# Patient Record
Sex: Male | Born: 1935 | Race: White | Hispanic: No | Marital: Married
Health system: Southern US, Community
[De-identification: ages and names within clinical notes are randomized; demographics above are authoritative.]

---

## 2007-05-04 ENCOUNTER — Ambulatory Visit (HOSPITAL_COMMUNITY): Admission: RE | Admit: 2007-05-04 | Discharge: 2007-05-05 | Payer: Self-pay | Admitting: Surgery

## 2008-12-03 IMAGING — CR DG CHEST 2V
2 series · 2 of 2 positions shown · non-contrast
Comparison: None.

CLINICAL DATA: Pre-operative respiratory exam for hernia repair.   
 CHEST - 2 VIEW:

[view not recorded (1 of 2)]
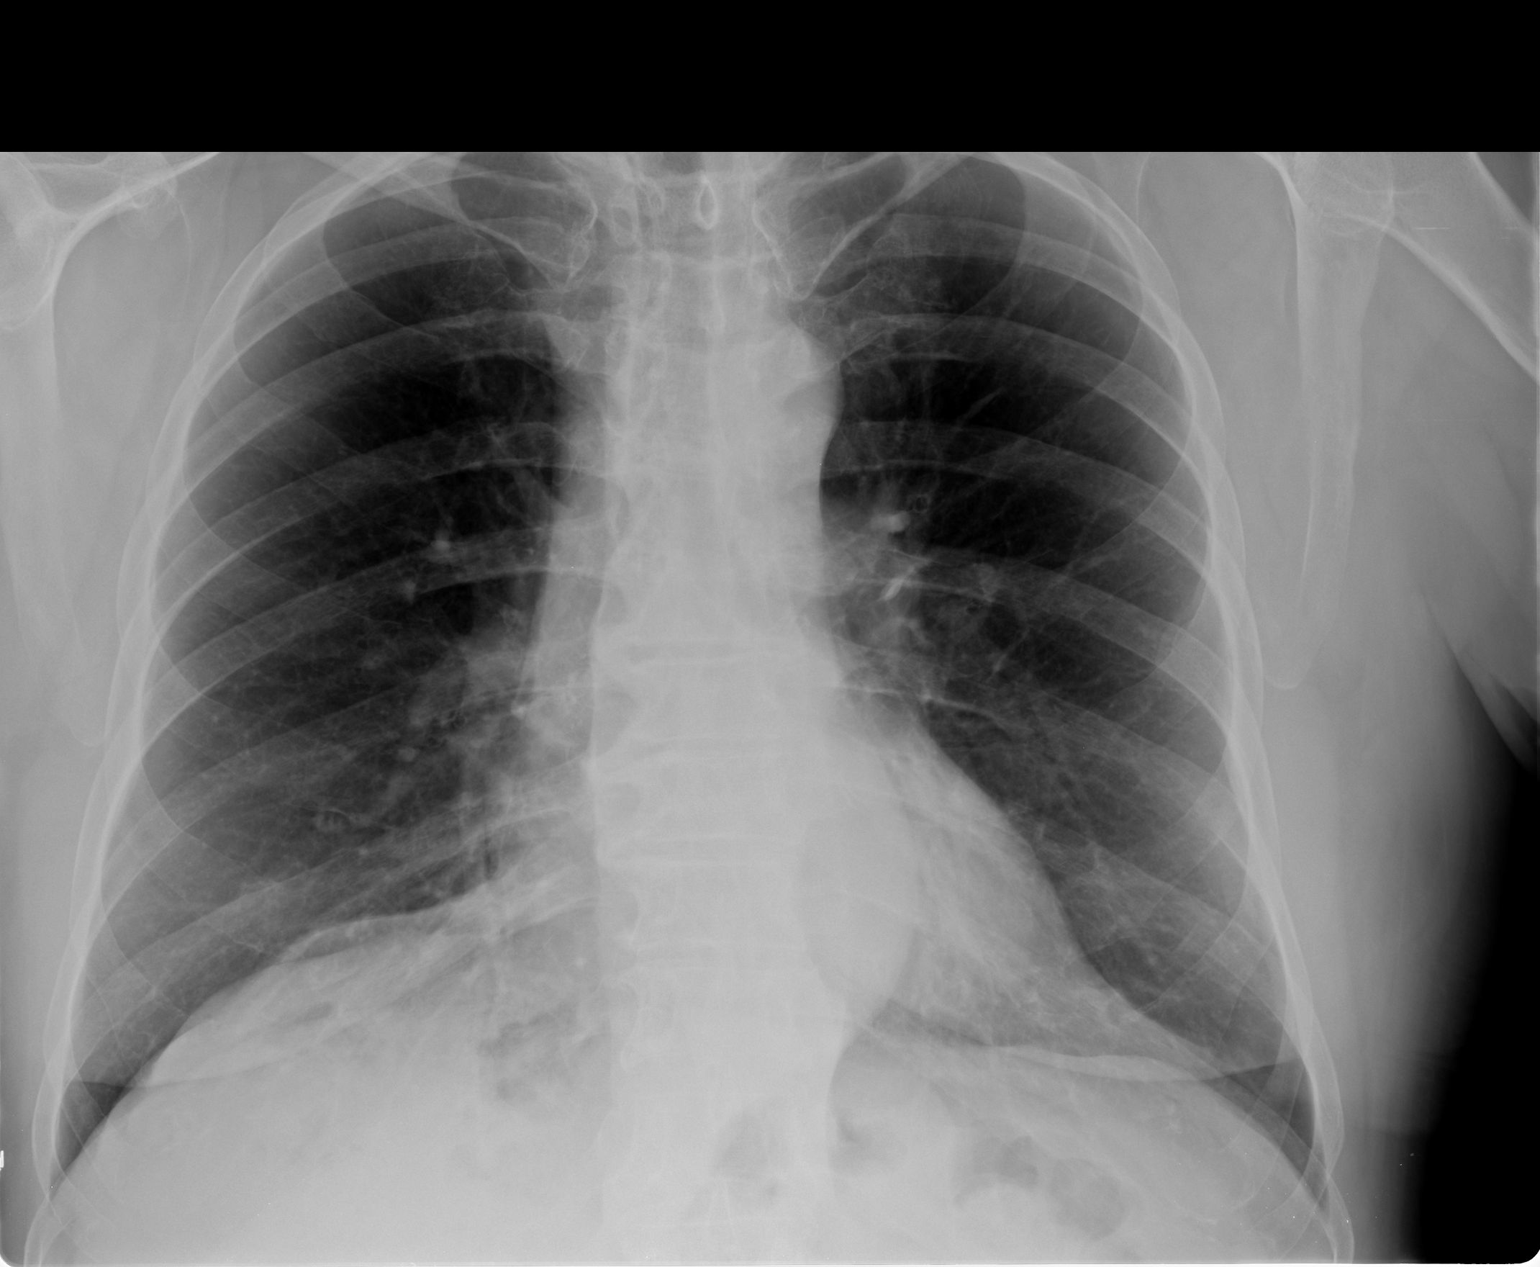

[view not recorded (2 of 2)]
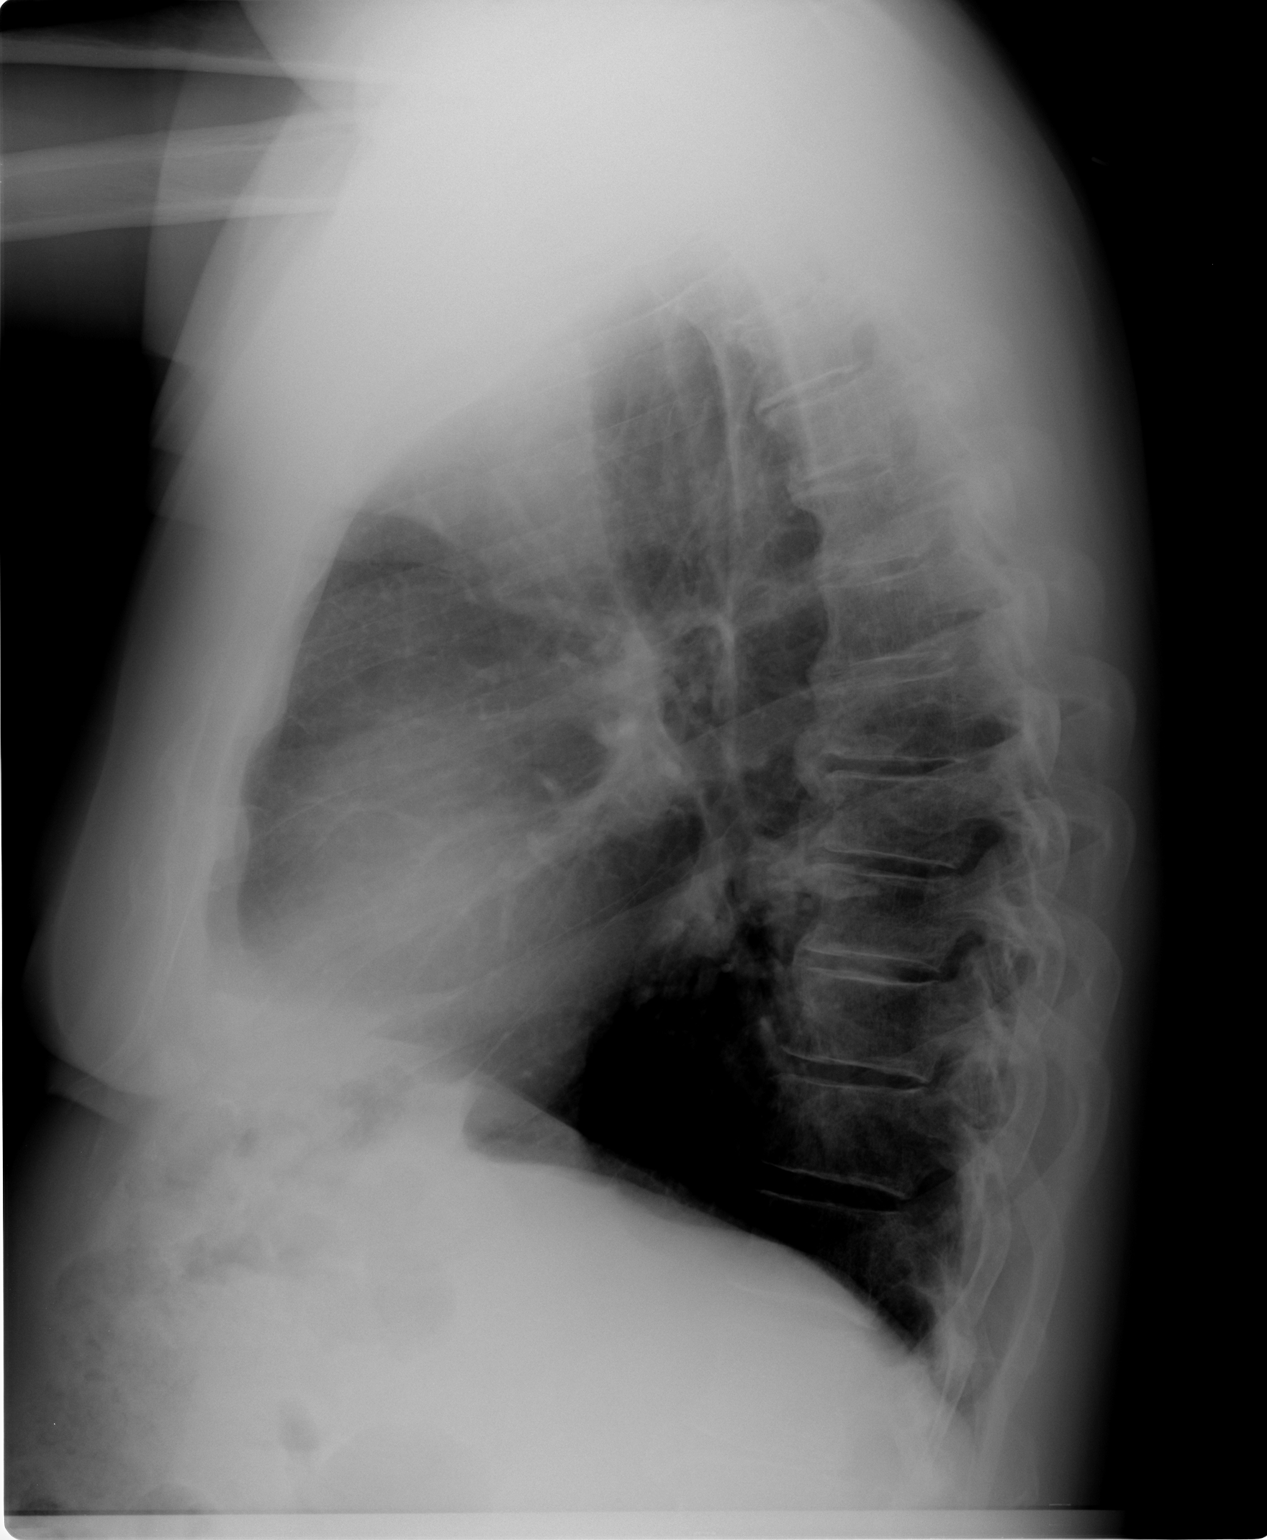

[2 of 2 positions shown; findings below may reference images not displayed]

FINDINGS: Heart size is normal.   The mediastinum is unremarkable.  There is mild scarring at the lung bases.  No evidence of active infiltrate, mass, effusion or collapse.  Bony structures unremarkable.
IMPRESSION: No active disease.

## 2009-07-03 ENCOUNTER — Encounter: Payer: Self-pay | Admitting: Cardiology

## 2009-12-04 ENCOUNTER — Encounter: Payer: Self-pay | Admitting: Cardiology

## 2010-01-08 ENCOUNTER — Encounter: Payer: Self-pay | Admitting: Cardiology

## 2010-03-20 ENCOUNTER — Encounter: Payer: Self-pay | Admitting: Cardiology

## 2010-03-24 ENCOUNTER — Ambulatory Visit: Payer: Self-pay | Admitting: Cardiology

## 2010-03-24 DIAGNOSIS — I498 Other specified cardiac arrhythmias: Secondary | ICD-10-CM

## 2010-03-24 DIAGNOSIS — R55 Syncope and collapse: Secondary | ICD-10-CM | POA: Insufficient documentation

## 2010-04-06 ENCOUNTER — Ambulatory Visit: Payer: Self-pay | Admitting: Cardiology

## 2010-04-06 ENCOUNTER — Encounter: Payer: Self-pay | Admitting: Cardiology

## 2010-04-10 ENCOUNTER — Encounter (INDEPENDENT_AMBULATORY_CARE_PROVIDER_SITE_OTHER): Payer: Self-pay | Admitting: *Deleted

## 2010-04-20 ENCOUNTER — Encounter: Payer: Self-pay | Admitting: Physician Assistant

## 2010-04-22 ENCOUNTER — Encounter (INDEPENDENT_AMBULATORY_CARE_PROVIDER_SITE_OTHER): Payer: Self-pay | Admitting: *Deleted

## 2010-05-05 ENCOUNTER — Ambulatory Visit: Payer: Self-pay | Admitting: Cardiology

## 2010-06-16 NOTE — Letter (Signed)
Summary: External Correspondence/ OFFICE VISIT DR. HASANAJ  External Correspondence/ OFFICE VISIT DR. HASANAJ   Imported By: Dorise Hiss 03/24/2010 09:49:57  _____________________________________________________________________  External Attachment:    Type:   Image     Comment:   External Document

## 2010-06-16 NOTE — Letter (Signed)
Summary: External Correspondence/ OFFICE VISIT DR. HASANAJ  External Correspondence/ OFFICE VISIT DR. HASANAJ   Imported By: Dorise Hiss 03/24/2010 09:51:21  _____________________________________________________________________  External Attachment:    Type:   Image     Comment:   External Document

## 2010-06-16 NOTE — Letter (Signed)
Summary: Engineer, materials at [facility]  [address]   [place], [place] [address]   Phone: [phone]  Fax: [phone]        April 10, 2010 MRN: [id number]   Perry Thompson [address] [place], [place]  [address]   Dear Perry Thompson,  Your test ordered by Selena Batten has been reviewed by your physician (or physician assistant) and was found to be normal or stable. Your physician (or physician assistant) felt no changes were needed at this time.  __X__ Echocardiogram  ____ Cardiac Stress Test  ____ Lab Work  ____ Peripheral vascular study of arms, legs or neck  ____ CT scan or X-ray  ____ Lung or Breathing test  ____ Other:   Thank you.   Hoover Brunette, LPN    Duane Boston, M.D., F.A.C.C. Thressa Sheller, M.D., F.A.C.C. Oneal Grout, M.D., F.A.C.C. Cheree Ditto, M.D., F.A.C.C. Daiva Nakayama, M.D., F.A.C.C. Kenney Houseman, M.D., F.A.C.C. Jeanne Ivan, PA-C

## 2010-06-16 NOTE — Letter (Signed)
Summary: Engineer, materials at [facility]  [address]   [place], [place] [address]   Phone: [phone]  Fax: [phone]        April 22, 2010 MRN: [id number]    Perry Thompson [address] [place], [place]  [address]    Dear [name],  Your test ordered by Selena Batten has been reviewed by your physician (or physician assistant) and was found to be normal or stable. Your physician (or physician assistant) felt no changes were needed at this time.  ____ Echocardiogram  ____ Cardiac Stress Test  ____ Lab Work  ____ Peripheral vascular study of arms, legs or neck  ____ CT scan or X-ray  ____ Lung or Breathing test  __X__ Other: Heart Monitor   Thank you.   Cyril Loosen, RN, BSN    Duane Boston, M.D., F.A.C.C. Thressa Sheller, M.D., F.A.C.C. Oneal Grout, M.D., F.A.C.C. Cheree Ditto, M.D., F.A.C.C. Daiva Nakayama, M.D., F.A.C.C. Kenney Houseman, M.D., F.A.C.C. Jeanne Ivan, PA-C

## 2010-06-16 NOTE — Procedures (Signed)
Summary: [facility] Final Summary  [facility] Final Summary   Imported By: Cyril Loosen, RN, BSN 04/22/2010 15:01:58  _____________________________________________________________________  External Attachment:    Type:   Image     Comment:   External Document  Appended Document: [facility] Final Summary Pt notified of results by letter.

## 2010-06-16 NOTE — Assessment & Plan Note (Signed)
Summary: NP-HEART RATE SLOW REQUESTING   Visit Type:  Initial Consult Primary Provider:  Hasanaj   History of Present Illness: patient is being evaluated for an episode of syncope. Reportedly the patient was admitted to ward on 11 4 2011. He stayed up while he was sitting in a restaurant at breakfast he passed out for roughly 30 secs after which time EMS was called. The patient initially refused to come to the emergency room and then came to the office. The patient was admitted for monitoring period with a sinus bradycardia. EKG today shows a heart rate of 57 beats per minute. He has a history of mild dementia and is on Aricept. He also has history of TIA but no prior cardiac history. He ruled out for myocardial infarction during the hospitalization. Episode in restaurant: Sitting there out about - happened suddenly, came around immediately. Happened 6 months ago presyncope. No scp, no sob- no heart disease. No orthostatsis, but wobbling when going to bathroom.    Preventive Screening-Counseling & Management  Alcohol-Tobacco     Smoking Status: quit     Year Quit: 1961  Current Medications (verified): 1)  Aspir-Low 81 Mg Tbec (Aspirin) .... Take 1 Tablet By Mouth Once A Day 2)  Aricept 10 Mg Tabs (Donepezil Hcl) .... Take 1 Tablet By Mouth Once A Day 3)  Celexa 10 Mg Tabs (Citalopram Hydrobromide) .... Take 1 Tablet By Mouth Once A Day  Allergies (verified): No Known Drug Allergies  Comments:  Nurse/Medical Assistant: The patient is currently on medications but does not know the name or dosage at this time. Instructed to contact our office with details. Will update medication list at that time.  Past History:  Past Medical History: implant prostate cancer Tonsillectomy Hernia surgery  Family History: Reviewed history and no changes required. no significant family history of heart disease  Social History: Reviewed history and no changes required. no tobacco no  alcoholSmoking Status:  quit  Review of Systems       The patient complains of fainting.  The patient denies fatigue, malaise, fever, weight gain/loss, vision loss, decreased hearing, hoarseness, chest pain, palpitations, shortness of breath, prolonged cough, wheezing, sleep apnea, coughing up blood, abdominal pain, blood in stool, nausea, vomiting, diarrhea, heartburn, incontinence, blood in urine, muscle weakness, joint pain, leg swelling, rash, skin lesions, headache, dizziness, depression, anxiety, enlarged lymph nodes, easy bruising or bleeding, and environmental allergies.    Vital Signs:  Patient profile:   77 year old male Height:      70 inches Weight:      204 pounds BMI:     29.38 Pulse rate:   59 / minute Pulse (ortho):   62 / minute BP sitting:   133 / 77  (left arm) BP standing:   127 / 78 Cuff size:   large  Vitals Entered By: Carlye Grippe (March 24, 2010 10:59 AM)  Nutrition Counseling: Patient's BMI is greater than 25 and therefore counseled on weight management options.  Serial Vital Signs/Assessments:  Time      Position  BP       Pulse  Resp  Temp     By 12:19 PM  Lying RA  106/63   64                    Gayle Hill, LPN 16:10 PM  Sitting   130/82   64  Hoover Brunette, LPN 14:78 PM  Standing  127/78   [address], LPN  Comments: 29:56 PM After 3 min:  124/80  56 After 5 min:  121/81  57   By: Hoover Brunette, LPN    Physical Exam  Additional Exam:  General: Well-developed, well-nourished in no distress head: Normocephalic and atraumatic eyes PERRLA/EOMI intact, conjunctiva and lids normal nose: No deformity or lesions mouth normal dentition, normal posterior pharynx neck: Supple, no JVD.  No masses, thyromegaly or abnormal cervical nodes lungs: Normal breath sounds bilaterally without wheezing.  Normal percussion heart: regular rate and rhythm with normal S1 and S2, no S3 or S4.  PMI is normal.  No pathological  murmurs abdomen: Normal bowel sounds, abdomen is soft and nontender without masses, organomegaly or hernias noted.  No hepatosplenomegaly musculoskeletal: Back normal, normal gait muscle strength and tone normal pulsus: Pulse is normal in all 4 extremities Extremities: No peripheral pitting edema neurologic: Alert and oriented x 3 skin: Intact without lesions or rashes cervical nodes: No significant adenopathy psychologic: Normal affect    Impression & Recommendations:  Problem # 1:  SYNCOPE AND COLLAPSE (ICD-780.2) Will order cardiac monitor and echocardiogram. Suspect bradycardia. No evidence of orthostasis. His updated medication list for this problem includes:    Aspir-low 81 Mg Tbec (Aspirin) .Marland Kitchen... Take 1 tablet by mouth once a day  Orders: Cardionet/Event Monitor (Cardionet/Event) 2-D Echocardiogram (2D Echo)  Problem # 2:  BRADYCARDIA (ICD-427.89) as outlined above. His updated medication list for this problem includes:    Aspir-low 81 Mg Tbec (Aspirin) .Marland Kitchen... Take 1 tablet by mouth once a day  Orders: Cardionet/Event Monitor (Cardionet/Event) 2-D Echocardiogram (2D Echo)  Patient Instructions: 1)  Echo 2)  21 day Cardionet monitor 3)  Follow up in  1 months

## 2010-06-16 NOTE — Letter (Signed)
Summary: External Correspondence/ OFFICE VISIT DR. HASANAJ  External Correspondence/ OFFICE VISIT DR. HASANAJ   Imported By: Dorise Hiss 03/24/2010 09:48:34  _____________________________________________________________________  External Attachment:    Type:   Image     Comment:   External Document

## 2010-09-29 NOTE — Op Note (Signed)
NAME:  MOHID, FURUYA                ACCOUNT NO.:  [id number]   MEDICAL RECORD NO.:  [id number]          PATIENT TYPE:  AMB   LOCATION:  SDS                          FACILITY:  [facility]   PHYSICIAN:  Sandria Bales. Ezzard Standing, M.D.  DATE OF BIRTH:  August 27, 1932   DATE OF PROCEDURE:  05/04/2007  DATE OF DISCHARGE:                               OPERATIVE REPORT   PREOPERATIVE DIAGNOSIS:  Right inguinal hernia, left spigelian hernia.   POSTOPERATIVE DIAGNOSIS:  Chronically incarcerated direct right inguinal  hernia, 2 x 4-cm left spigelian hernia.   PROCEDURE:  Laparoscopic right inguinal hernia repair with Bard 3-D  preformed mesh.   Laparoscopic ventral spigelian hernia repair with 12-cm Parietex mesh.   SURGEON:  Dr. Ezzard Standing  no first assistant.   ANESTHESIA:  General.   ESTIMATED BLOOD LOSS:  Minimal.   INDICATIONS FOR PROCEDURE:  Mr. Whitner is a 77 year old white male who  is a patient of Dr. Olena Leatherwood from [facility] who has  had a longstanding right inguinal hernia and left lower abdominal wall  hernia that I think is a spigelian hernia.   I discussed with him about repairing these both laparoscopically.  I  discussed both the indications and risks of repairing both hernias, the  risk includes, but are not limited to, bleeding, infection, nerve  injury, recurrence of the hernia.   OPERATIVE NOTE:  The patient was placed in a supine position, given a  general anesthetic.  His arms were tucked to his sides.  A Foley  catheter was placed.  PAS stockings were placed.  He was given a gram of  Ancef at the initiation of the procedure.  Before surgery, I had marked  where a spigelian hernia was, I had marked his right inguinal hernia,  and a time-out was held identifying the patient and the procedure.   I prepped hi abdomen with Betadine solution.  I put an Ioban drape over  his belly.  I repaired the right inguinal hernia first.  I went through  an infraumbilical incision  and got into the preperitoneal space behind  the right anterior rectus muscle.  I used the Covidien preperitoneal  balloon to dilate the preperitoneal space.  I place the 10mm Hasson  trocar infraumbilically and I placed two 5-mm trocars, one in the right  lower quadrant and one in the left lower quadrant and dissected out his  preperitoneal space.   I identified the pubic tubercle and Cooper's ligament.  He had a fairly  large, chronically incarcerated, direct inguinal hernia which was  somewhat difficult to reduce.  He had a lot of scar tissue that I was  even out laterally.  I identified his cord structures.  I saw no  evidence of an indirect inguinal hernia.   I thought therefore he would be a reasonable candidate for the large  Bard 3-D preformed mesh.  I stapled this medially to the pubic tubercle,  inferiorly to Cooper's ligament, superiorly to the transversalis fascia,  and superolaterally out to transversalis fascia.  I avoided the area  lateral to the iliac vessels and  inferiorly to the ileopubic tract.  I  used a total of 10 staples, and the mesh lay flat.  It covered the  hernia well.  I took photos of this.   I then removed the trocars and reinserted my umbilical trocar into the  intra-abdominal cavity, and I removed my two 5-mm lower abdominal  trocars.   I then carried out abdominal exploration.  Right and left lobes of liver  were unremarkable.  The bowel I could see was unremarkable.  Unfortunately, his lower abdomen had a fair amount of pneumatosis in the  subfascial space/subperitoneal space, but I thought I could identify the  spigelian hernia in the left abdomen fairly clearly.  It was a little  more lateral than what I had originally imagined and a little bit more  inferior.  The hernia defect itself was only about 2 x 4 cm, it had  omentum adhered to the edge of the defect and it looked like this could  be covered well a 12-cm circular Parietex mesh.   I  therefore put 8 sutures of 0 Novafil in the Parietex mesh, passed this  into the abdominal cavity and oriented it and pulled this up to the  anterior abdominal wall using the EndoCatch.   I then stapled the mesh in with 22 staples.  The mesh lay flat.  The  colon was probably 3 or 4 cm along the lateral wall from this.   The patient tolerated the procedure well.  The mesh lay flat.  Again,  the hernia repair had a lot of extra peritoneal and subperitoneal air  which really did not allow me to really see how hernia mesh lay intra-  abdominally.  I then removed all the trocars.  I closed with the  umbilical port with two 0 Vicryl sutures.  I closed the skin at each  port with a 5-0 Vicryl suture, painted the wounds with tincture of  Benzoin and Steri-stripped them.   The patient tolerated the procedure well, was transported to the  recovery room in good condition.  Sponge and needle count were correct.      Sandria Bales. Ezzard Standing, M.D.  Electronically Signed     DHN/MEDQ  D:  05/04/2007  T:  05/05/2007  Job:  [id number]   cc:   Lia Hopping

## 2011-02-19 LAB — COMPREHENSIVE METABOLIC PANEL
ALT: 19
Albumin: 3.9
CO2: 28
Calcium: 9.1
Chloride: 102
Creatinine, Ser: 0.91
Glucose, Bld: 104 — ABNORMAL HIGH
Potassium: 4.2
Sodium: 136

## 2011-02-19 LAB — CBC
Hemoglobin: 15
MCHC: 34.4
MCV: 92.3
RBC: 4.71
WBC: 6.6

## 2011-02-19 LAB — DIFFERENTIAL
Basophils Relative: 0
Eosinophils Absolute: 0.1 — ABNORMAL LOW

## 2011-10-22 DIAGNOSIS — R42 Dizziness and giddiness: Secondary | ICD-10-CM

## 2013-05-17 DEATH — deceased

## 2013-06-04 ENCOUNTER — Telehealth: Payer: Self-pay

## 2013-06-04 NOTE — Telephone Encounter (Signed)
Patient past away @ [facility] per [facility] in [place] [employer] °
